# Patient Record
Sex: Male | Born: 1960 | Race: Black or African American | Hispanic: No | Marital: Single | Smoking: Current every day smoker
Health system: Southern US, Community
[De-identification: ages and names within clinical notes are randomized; demographics above are authoritative.]

## PROBLEM LIST (undated history)

## (undated) DIAGNOSIS — M549 Dorsalgia, unspecified: Secondary | ICD-10-CM

## (undated) DIAGNOSIS — G8929 Other chronic pain: Secondary | ICD-10-CM

## (undated) DIAGNOSIS — M5136 Other intervertebral disc degeneration, lumbar region: Secondary | ICD-10-CM

## (undated) DIAGNOSIS — W3400XA Accidental discharge from unspecified firearms or gun, initial encounter: Secondary | ICD-10-CM

## (undated) DIAGNOSIS — M51369 Other intervertebral disc degeneration, lumbar region without mention of lumbar back pain or lower extremity pain: Secondary | ICD-10-CM

## (undated) DIAGNOSIS — M542 Cervicalgia: Secondary | ICD-10-CM

## (undated) DIAGNOSIS — M5030 Other cervical disc degeneration, unspecified cervical region: Secondary | ICD-10-CM

## (undated) HISTORY — PX: ARTHROSCOPY KNEE W/ DRILLING: SUR92

## (undated) HISTORY — PX: HERNIA REPAIR: SHX51

## (undated) HISTORY — PX: KNEE SURGERY: SHX244

---

## 2001-02-24 ENCOUNTER — Encounter: Payer: Self-pay | Admitting: Emergency Medicine

## 2001-02-24 ENCOUNTER — Emergency Department (HOSPITAL_COMMUNITY): Admission: EM | Admit: 2001-02-24 | Discharge: 2001-02-24 | Payer: Self-pay | Admitting: Emergency Medicine

## 2001-04-26 ENCOUNTER — Emergency Department (HOSPITAL_COMMUNITY): Admission: EM | Admit: 2001-04-26 | Discharge: 2001-04-26 | Payer: Self-pay | Admitting: Emergency Medicine

## 2001-05-03 ENCOUNTER — Emergency Department (HOSPITAL_COMMUNITY): Admission: EM | Admit: 2001-05-03 | Discharge: 2001-05-03 | Payer: Self-pay

## 2001-09-28 ENCOUNTER — Emergency Department (HOSPITAL_COMMUNITY): Admission: EM | Admit: 2001-09-28 | Discharge: 2001-09-28 | Payer: Self-pay | Admitting: Emergency Medicine

## 2001-10-15 ENCOUNTER — Emergency Department (HOSPITAL_COMMUNITY): Admission: EM | Admit: 2001-10-15 | Discharge: 2001-10-15 | Payer: Self-pay | Admitting: Emergency Medicine

## 2002-09-16 ENCOUNTER — Emergency Department (HOSPITAL_COMMUNITY): Admission: AD | Admit: 2002-09-16 | Discharge: 2002-09-17 | Payer: Self-pay | Admitting: Emergency Medicine

## 2007-02-20 ENCOUNTER — Emergency Department (HOSPITAL_COMMUNITY): Admission: EM | Admit: 2007-02-20 | Discharge: 2007-02-20 | Payer: Self-pay | Admitting: Emergency Medicine

## 2007-11-18 ENCOUNTER — Emergency Department (HOSPITAL_COMMUNITY): Admission: EM | Admit: 2007-11-18 | Discharge: 2007-11-18 | Payer: Self-pay | Admitting: Emergency Medicine

## 2008-01-05 ENCOUNTER — Emergency Department (HOSPITAL_COMMUNITY): Admission: EM | Admit: 2008-01-05 | Discharge: 2008-01-05 | Payer: Self-pay | Admitting: Emergency Medicine

## 2010-11-15 ENCOUNTER — Emergency Department (HOSPITAL_COMMUNITY): Payer: Self-pay

## 2010-11-15 ENCOUNTER — Encounter (HOSPITAL_COMMUNITY): Payer: Self-pay

## 2010-11-15 ENCOUNTER — Emergency Department (HOSPITAL_COMMUNITY)
Admission: EM | Admit: 2010-11-15 | Discharge: 2010-11-15 | Disposition: A | Discharge status: Home / Self Care | Payer: Self-pay | Attending: Emergency Medicine | Admitting: Emergency Medicine

## 2010-11-15 DIAGNOSIS — S139XXA Sprain of joints and ligaments of unspecified parts of neck, initial encounter: Secondary | ICD-10-CM | POA: Insufficient documentation

## 2010-11-15 DIAGNOSIS — M5030 Other cervical disc degeneration, unspecified cervical region: Secondary | ICD-10-CM | POA: Insufficient documentation

## 2010-11-15 DIAGNOSIS — Y92410 Unspecified street and highway as the place of occurrence of the external cause: Secondary | ICD-10-CM | POA: Insufficient documentation

## 2010-11-15 DIAGNOSIS — IMO0002 Reserved for concepts with insufficient information to code with codable children: Secondary | ICD-10-CM | POA: Insufficient documentation

## 2010-11-15 DIAGNOSIS — M542 Cervicalgia: Secondary | ICD-10-CM | POA: Insufficient documentation

## 2010-11-15 DIAGNOSIS — M25519 Pain in unspecified shoulder: Secondary | ICD-10-CM | POA: Insufficient documentation

## 2010-11-15 DIAGNOSIS — R51 Headache: Secondary | ICD-10-CM | POA: Insufficient documentation

## 2011-09-18 ENCOUNTER — Emergency Department (HOSPITAL_COMMUNITY)
Admission: EM | Admit: 2011-09-18 | Discharge: 2011-09-18 | Disposition: A | Discharge status: Home / Self Care | Payer: Self-pay | Attending: Emergency Medicine | Admitting: Emergency Medicine

## 2011-09-18 ENCOUNTER — Encounter (HOSPITAL_COMMUNITY): Payer: Self-pay | Admitting: Emergency Medicine

## 2011-09-18 DIAGNOSIS — M79609 Pain in unspecified limb: Secondary | ICD-10-CM | POA: Insufficient documentation

## 2011-09-18 DIAGNOSIS — Z1889 Other specified retained foreign body fragments: Secondary | ICD-10-CM | POA: Insufficient documentation

## 2011-09-18 DIAGNOSIS — M79606 Pain in leg, unspecified: Secondary | ICD-10-CM

## 2011-09-18 DIAGNOSIS — F17200 Nicotine dependence, unspecified, uncomplicated: Secondary | ICD-10-CM | POA: Insufficient documentation

## 2011-09-18 MED ORDER — HYDROCODONE-ACETAMINOPHEN 5-325 MG PO TABS: ORAL_TABLET | ORAL | Status: AC

## 2011-09-18 MED ORDER — HYDROCODONE-ACETAMINOPHEN 5-325 MG PO TABS
1.0000 | ORAL_TABLET | Freq: Once | ORAL | Status: AC
  Administered 2011-09-18: 1 via ORAL
  Filled 2011-09-18: qty 1

## 2011-09-18 MED ORDER — NAPROXEN 500 MG PO TABS: 500.0000 mg | ORAL_TABLET | Freq: Two times a day (BID) | ORAL | Status: DC
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
## 2011-09-18 NOTE — ED Provider Notes (Signed)
Medical screening examination/treatment/procedure(s) were performed by non-physician practitioner and as supervising physician I was immediately available for consultation/collaboration.   Lyanne Co, MD 09/18/11 1620
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
## 2011-11-12 ENCOUNTER — Emergency Department (HOSPITAL_COMMUNITY)
Admission: EM | Admit: 2011-11-12 | Discharge: 2011-11-12 | Disposition: A | Discharge status: Home / Self Care | Payer: Self-pay | Attending: Emergency Medicine | Admitting: Emergency Medicine

## 2011-11-12 ENCOUNTER — Encounter (HOSPITAL_COMMUNITY): Payer: Self-pay

## 2011-11-12 ENCOUNTER — Emergency Department (HOSPITAL_COMMUNITY): Payer: Self-pay

## 2011-11-12 DIAGNOSIS — S8990XA Unspecified injury of unspecified lower leg, initial encounter: Secondary | ICD-10-CM | POA: Insufficient documentation

## 2011-11-12 DIAGNOSIS — Z8249 Family history of ischemic heart disease and other diseases of the circulatory system: Secondary | ICD-10-CM | POA: Insufficient documentation

## 2011-11-12 DIAGNOSIS — X58XXXA Exposure to other specified factors, initial encounter: Secondary | ICD-10-CM | POA: Insufficient documentation

## 2011-11-12 DIAGNOSIS — Y990 Civilian activity done for income or pay: Secondary | ICD-10-CM | POA: Insufficient documentation

## 2011-11-12 DIAGNOSIS — Z8489 Family history of other specified conditions: Secondary | ICD-10-CM | POA: Insufficient documentation

## 2011-11-12 DIAGNOSIS — F17200 Nicotine dependence, unspecified, uncomplicated: Secondary | ICD-10-CM | POA: Insufficient documentation

## 2011-11-12 DIAGNOSIS — Z833 Family history of diabetes mellitus: Secondary | ICD-10-CM | POA: Insufficient documentation

## 2011-11-12 MED ORDER — OXYCODONE-ACETAMINOPHEN 5-325 MG PO TABS
1.0000 | ORAL_TABLET | Freq: Once | ORAL | Status: AC
  Administered 2011-11-12: 1 via ORAL
  Filled 2011-11-12: qty 1

## 2011-11-12 MED ORDER — OXYCODONE-ACETAMINOPHEN 5-325 MG PO TABS: 1.0000 | ORAL_TABLET | Freq: Four times a day (QID) | ORAL | Status: AC | PRN

## 2011-11-12 MED ORDER — NAPROXEN 500 MG PO TABS: 500.0000 mg | ORAL_TABLET | Freq: Two times a day (BID) | ORAL | Status: DC

## 2011-11-12 NOTE — ED Notes (Signed)
Discharge instructions reviewed w/ pt., verbalizes understanding. Two prescriptions provided at discharge. 

## 2011-11-12 NOTE — ED Notes (Signed)
Patient reports that he ws climbing out of a hole at work today and heard and felt a pop in the right knee. Patient has swelling of right knee. Patient has increased pain when he walks.

## 2011-11-12 NOTE — ED Provider Notes (Signed)
History     CSN: [id number]  Arrival date & time 11/12/11  4098   First MD Initiated Contact with Patient 11/12/11 1859      Chief Complaint  Patient presents with  . Knee Pain    (Consider location/radiation/quality/duration/timing/severity/associated sxs/prior treatment) HPI Comments: Patient presents with complaint of acute onset of right knee pain that occurred while he was climbing out of a hole while at work. Patient attempted to stand up on his right leg and felt a pop and immediately fell to the ground. Patient complains currently of pain and swelling. Pain does not radiate. He denies weakness, numbness, tingling in lower leg. Walking and palpation makes the pain worse. Rest makes it better. Patient has history of knee surgery, anterior cruciate ligament repair, hardware placed in 1998 after an accident.  Patient is a 51 y.o. male presenting with knee pain. The history is provided by the patient.  Knee Pain This is a new problem. The current episode started today. The problem occurs constantly. The problem has been unchanged. Associated symptoms include arthralgias. Pertinent negatives include no joint swelling, neck pain, numbness or weakness. The symptoms are aggravated by bending and walking. He has tried nothing for the symptoms.    History reviewed. No pertinent past medical history.  Past Surgical History  Procedure Date  . Arthroscopy knee w/ drilling   . Hernia repair     Family History  Problem Relation Age of Onset  . Heart failure Mother   . Diabetes Father   . Aneurysm Sister   . Diabetes Sister     History  Substance Use Topics  . Smoking status: Current Every Day Smoker -- 1.0 packs/day    Types: Cigarettes  . Smokeless tobacco: Never Used  . Alcohol Use: No      Review of Systems  Constitutional: Negative for activity change.  HENT: Negative for neck pain.   Musculoskeletal: Positive for arthralgias. Negative for back pain, joint swelling and  gait problem.  Skin: Negative for wound.  Neurological: Negative for weakness and numbness.    Allergies  Review of patient's allergies indicates no known allergies.  Home Medications   Current Outpatient Rx  Name Route Sig Dispense Refill  . HYDROCODONE-ACETAMINOPHEN 5-325 MG PO TABS Oral Take 1 tablet by mouth every 6 (six) hours as needed. For pain.    Marland Kitchen NAPROXEN 500 MG PO TABS Oral Take 1 tablet (500 mg total) by mouth 2 (two) times daily. 20 tablet 0    BP 131/84  Pulse 62  Temp 99.1 F (37.3 C) (Oral)  Resp 18  SpO2 98%  Physical Exam  Nursing note and vitals reviewed. Constitutional: He appears well-developed and well-nourished.  HENT:  Head: Normocephalic and atraumatic.  Eyes: Conjunctivae normal are normal.  Neck: Normal range of motion. Neck supple.  Cardiovascular: Normal pulses.   Pulses:      Dorsalis pedis pulses are 2+ on the right side, and 2+ on the left side.       Posterior tibial pulses are 2+ on the right side, and 2+ on the left side.  Musculoskeletal: He exhibits edema and tenderness.       Right hip: Normal.       Right knee: He exhibits effusion. He exhibits normal range of motion. tenderness found. Medial joint line and lateral joint line tenderness noted. No patellar tendon tenderness noted.       Right ankle: Normal.  Neurological: He is alert. No sensory deficit.  Motor, sensation, and vascular distal to the injury is fully intact.   Skin: Skin is warm and dry.  Psychiatric: He has a normal mood and affect.    ED Course  Procedures (including critical care time)  Labs Reviewed - No data to display Dg Knee Complete 4 Views Right  11/12/2011  *RADIOLOGY REPORT*  Clinical Data: Right knee pain following a popping sensation. Previous knee surgery.  RIGHT KNEE - COMPLETE 4+ VIEW  Comparison: 11/15/2010.  Findings: Stable changes of previous anterior cruciate ligament repair.  A moderate sized effusion is again demonstrated.  Tricompartmental spur formation is also again demonstrated.  No fracture or dislocation seen.  IMPRESSION:  1.  Moderate sized effusion without significant change. 2.  Stable tricompartmental degenerative changes. 3.  Stable changes of previous ACL repair. 4.  No fracture.   Original Report Authenticated By: Darrol Angel, M.D.      1. Knee injury     7:41 PM Patient seen and examined. X-ray ordered.   Vital signs reviewed and are as follows: Filed Vitals:   11/12/11 1901  BP: 131/84  Pulse: 62  Temp: 99.1 F (37.3 C)  Resp: 18   7:47 PM crutches and knee immobilizer given orthostatic. X-ray reviewed. Patient informed of results. Patient counseled on rice protocol and need for followup with orthopedic doctor if no improvement in one week. He verbalizes understanding and agrees with plan.  Patient counseled on use of narcotic pain medications. Counseled not to combine these medications with others containing tylenol. Urged not to drink alcohol, drive, or perform any other activities that requires focus while taking these medications. The patient verbalizes understanding and agrees with the plan.    MDM  Patient with knee injury, negative x-rays. Will give crutches and knee immobilizer and have patient follow up with orthopedics. No concern for vascular injury in the leg. Compartments of lower leg are soft.       Renne Crigler, [place] 11/12/11 [phone]

## 2011-11-13 NOTE — ED Provider Notes (Signed)
Medical screening examination/treatment/procedure(s) were performed by non-physician practitioner and as supervising physician I was immediately available for consultation/collaboration.  Amiee Wiley R. Giann Obara, MD 11/13/11 0017 

## 2012-07-15 ENCOUNTER — Emergency Department (HOSPITAL_COMMUNITY): Payer: Self-pay

## 2012-07-15 ENCOUNTER — Encounter (HOSPITAL_COMMUNITY): Payer: Self-pay

## 2012-07-15 ENCOUNTER — Emergency Department (HOSPITAL_COMMUNITY)
Admission: EM | Admit: 2012-07-15 | Discharge: 2012-07-15 | Disposition: A | Discharge status: Home / Self Care | Payer: Self-pay | Attending: Emergency Medicine | Admitting: Emergency Medicine

## 2012-07-15 DIAGNOSIS — F17200 Nicotine dependence, unspecified, uncomplicated: Secondary | ICD-10-CM | POA: Insufficient documentation

## 2012-07-15 DIAGNOSIS — Z9889 Other specified postprocedural states: Secondary | ICD-10-CM | POA: Insufficient documentation

## 2012-07-15 DIAGNOSIS — Z87828 Personal history of other (healed) physical injury and trauma: Secondary | ICD-10-CM | POA: Insufficient documentation

## 2012-07-15 DIAGNOSIS — M549 Dorsalgia, unspecified: Secondary | ICD-10-CM | POA: Insufficient documentation

## 2012-07-15 DIAGNOSIS — G8921 Chronic pain due to trauma: Secondary | ICD-10-CM | POA: Insufficient documentation

## 2012-07-15 DIAGNOSIS — M25561 Pain in right knee: Secondary | ICD-10-CM

## 2012-07-15 DIAGNOSIS — M25569 Pain in unspecified knee: Secondary | ICD-10-CM | POA: Insufficient documentation

## 2012-07-15 HISTORY — DX: Accidental discharge from unspecified firearms or gun, initial encounter: W34.00XA

## 2012-07-15 HISTORY — DX: Other chronic pain: G89.29

## 2012-07-15 MED ORDER — OXYCODONE-ACETAMINOPHEN 5-325 MG PO TABS
1.0000 | ORAL_TABLET | Freq: Once | ORAL | Status: AC
  Administered 2012-07-15: 1 via ORAL
  Filled 2012-07-15: qty 1

## 2012-07-15 MED ORDER — HYDROCODONE-ACETAMINOPHEN 5-325 MG PO TABS: 1.0000 | ORAL_TABLET | Freq: Four times a day (QID) | ORAL | Status: DC | PRN

## 2012-07-15 MED ORDER — NAPROXEN 375 MG PO TABS: 375.0000 mg | ORAL_TABLET | Freq: Two times a day (BID) | ORAL | Status: DC
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
## 2012-07-15 NOTE — ED Notes (Signed)
Pt present swith no acute dsitress.  Denies new injury- reports rt leg pain- x 2 months  Ran out of meds- oxycodone and neoproxine. Last taken two days ago.

## 2012-07-15 NOTE — Progress Notes (Signed)
[facility] has seen patient. Patient stated that he is waiting on insurance through his job. I gave patient a list of Primary Care Resources. In case he needed to follow-up with a doctor before his insurance is in effect.

## 2012-07-15 NOTE — ED Notes (Signed)
Ortho tech called 

## 2012-07-26 NOTE — ED Provider Notes (Signed)
Medical screening examination/treatment/procedure(s) were performed by non-physician practitioner and as supervising physician I was immediately available for consultation/collaboration.   Hurman Horn, MD 07/26/12 [phone]

## 2012-08-04 ENCOUNTER — Emergency Department (HOSPITAL_COMMUNITY): Payer: Self-pay

## 2012-08-04 ENCOUNTER — Encounter (HOSPITAL_COMMUNITY): Payer: Self-pay | Admitting: *Deleted

## 2012-08-04 ENCOUNTER — Emergency Department (HOSPITAL_COMMUNITY)
Admission: EM | Admit: 2012-08-04 | Discharge: 2012-08-05 | Disposition: A | Discharge status: Home / Self Care | Payer: Self-pay | Attending: Emergency Medicine | Admitting: Emergency Medicine

## 2012-08-04 DIAGNOSIS — G8929 Other chronic pain: Secondary | ICD-10-CM | POA: Insufficient documentation

## 2012-08-04 DIAGNOSIS — R42 Dizziness and giddiness: Secondary | ICD-10-CM | POA: Insufficient documentation

## 2012-08-04 DIAGNOSIS — M549 Dorsalgia, unspecified: Secondary | ICD-10-CM | POA: Insufficient documentation

## 2012-08-04 DIAGNOSIS — F17200 Nicotine dependence, unspecified, uncomplicated: Secondary | ICD-10-CM | POA: Insufficient documentation

## 2012-08-04 DIAGNOSIS — Z87828 Personal history of other (healed) physical injury and trauma: Secondary | ICD-10-CM | POA: Insufficient documentation

## 2012-08-04 LAB — CBC WITH DIFFERENTIAL/PLATELET
Basophils Absolute: 0 10*3/uL (ref 0.0–0.1)
Basophils Relative: 1 % (ref 0–1)
Eosinophils Absolute: 0.3 10*3/uL (ref 0.0–0.7)
Eosinophils Relative: 4 % (ref 0–5)
HCT: 39.3 % (ref 39.0–52.0)
Hemoglobin: 12.8 g/dL — ABNORMAL LOW (ref 13.0–17.0)
Lymphocytes Relative: 56 % — ABNORMAL HIGH (ref 12–46)
Lymphs Abs: 3.5 10*3/uL (ref 0.7–4.0)
MCH: 27.2 pg (ref 26.0–34.0)
MCHC: 32.6 g/dL (ref 30.0–36.0)
MCV: 83.6 fL (ref 78.0–100.0)
Monocytes Absolute: 0.4 10*3/uL (ref 0.1–1.0)
Monocytes Relative: 6 % (ref 3–12)
Neutro Abs: 2.1 10*3/uL (ref 1.7–7.7)
Neutrophils Relative %: 33 % — ABNORMAL LOW (ref 43–77)
Platelets: 243 10*3/uL (ref 150–400)
RBC: 4.7 MIL/uL (ref 4.22–5.81)
RDW: 14.5 % (ref 11.5–15.5)
WBC: 6.3 10*3/uL (ref 4.0–10.5)

## 2012-08-04 LAB — COMPREHENSIVE METABOLIC PANEL
ALT: 11 U/L (ref 0–53)
AST: 19 U/L (ref 0–37)
Albumin: 3.9 g/dL (ref 3.5–5.2)
Alkaline Phosphatase: 66 U/L (ref 39–117)
BUN: 13 mg/dL (ref 6–23)
CO2: 29 mEq/L (ref 19–32)
Calcium: 9.3 mg/dL (ref 8.4–10.5)
Chloride: 103 mEq/L (ref 96–112)
Creatinine, Ser: 0.92 mg/dL (ref 0.50–1.35)
GFR calc Af Amer: >90 mL/min (ref >90)
GFR calc non Af Amer: >90 mL/min (ref >90)
Glucose, Bld: 102 mg/dL — ABNORMAL HIGH (ref 70–99)
Potassium: 3.9 mEq/L (ref 3.5–5.1)
Sodium: 141 mEq/L (ref 135–145)
Total Bilirubin: 0.3 mg/dL (ref 0.3–1.2)
Total Protein: 7.2 g/dL (ref 6.0–8.3)

## 2012-08-04 LAB — GLUCOSE, CAPILLARY: Glucose-Capillary: 104 mg/dL — ABNORMAL HIGH (ref 70–99)
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
## 2012-08-04 NOTE — ED Notes (Signed)
C/o dizziness x 2 months
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
## 2012-09-06 ENCOUNTER — Encounter (HOSPITAL_COMMUNITY): Payer: Self-pay | Admitting: Emergency Medicine

## 2012-09-06 ENCOUNTER — Emergency Department (HOSPITAL_COMMUNITY)
Admission: EM | Admit: 2012-09-06 | Discharge: 2012-09-06 | Disposition: A | Discharge status: Home / Self Care | Payer: Self-pay | Attending: Emergency Medicine | Admitting: Emergency Medicine

## 2012-09-06 DIAGNOSIS — M51379 Other intervertebral disc degeneration, lumbosacral region without mention of lumbar back pain or lower extremity pain: Secondary | ICD-10-CM | POA: Insufficient documentation

## 2012-09-06 DIAGNOSIS — M542 Cervicalgia: Secondary | ICD-10-CM | POA: Insufficient documentation

## 2012-09-06 DIAGNOSIS — G8929 Other chronic pain: Secondary | ICD-10-CM | POA: Insufficient documentation

## 2012-09-06 DIAGNOSIS — Z87828 Personal history of other (healed) physical injury and trauma: Secondary | ICD-10-CM | POA: Insufficient documentation

## 2012-09-06 DIAGNOSIS — M5030 Other cervical disc degeneration, unspecified cervical region: Secondary | ICD-10-CM | POA: Insufficient documentation

## 2012-09-06 DIAGNOSIS — M5137 Other intervertebral disc degeneration, lumbosacral region: Secondary | ICD-10-CM | POA: Insufficient documentation

## 2012-09-06 DIAGNOSIS — F17200 Nicotine dependence, unspecified, uncomplicated: Secondary | ICD-10-CM | POA: Insufficient documentation

## 2012-09-06 HISTORY — DX: Other cervical disc degeneration, unspecified cervical region: M50.30

## 2012-09-06 HISTORY — DX: Cervicalgia: M54.2

## 2012-09-06 HISTORY — DX: Other intervertebral disc degeneration, lumbar region without mention of lumbar back pain or lower extremity pain: M51.369

## 2012-09-06 HISTORY — DX: Other intervertebral disc degeneration, lumbar region: M51.36

## 2012-09-06 HISTORY — DX: Other chronic pain: G89.29

## 2012-09-06 HISTORY — DX: Dorsalgia, unspecified: M54.9

## 2012-09-06 MED ORDER — NAPROXEN 250 MG PO TABS: 250.0000 mg | ORAL_TABLET | Freq: Two times a day (BID) | ORAL | Status: DC

## 2012-09-06 MED ORDER — HYDROCODONE-ACETAMINOPHEN 5-325 MG PO TABS: ORAL_TABLET | ORAL | Status: DC

## 2012-09-06 MED ORDER — METHOCARBAMOL 500 MG PO TABS: 1000.0000 mg | ORAL_TABLET | Freq: Four times a day (QID) | ORAL | Status: DC | PRN

## 2012-09-06 NOTE — Progress Notes (Signed)
[facility] did not get to see pt but will be sending him information about the [facility], using the address provided.

## 2012-09-06 NOTE — ED Notes (Signed)
Pt reports lower back pain that began last night.  Denies any recent injury/bending/straining.  States pain is midline and it hurts to move.  Denies numbness/tingling down legs.  Pt able to ambulate
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
## 2013-03-24 ENCOUNTER — Emergency Department (HOSPITAL_COMMUNITY)
Admission: EM | Admit: 2013-03-24 | Discharge: 2013-03-24 | Disposition: A | Discharge status: Home / Self Care | Payer: Self-pay | Attending: Emergency Medicine | Admitting: Emergency Medicine

## 2013-03-24 ENCOUNTER — Emergency Department (HOSPITAL_COMMUNITY): Payer: Self-pay

## 2013-03-24 ENCOUNTER — Encounter (HOSPITAL_COMMUNITY): Payer: Self-pay | Admitting: Emergency Medicine

## 2013-03-24 DIAGNOSIS — Z87828 Personal history of other (healed) physical injury and trauma: Secondary | ICD-10-CM | POA: Insufficient documentation

## 2013-03-24 DIAGNOSIS — F17200 Nicotine dependence, unspecified, uncomplicated: Secondary | ICD-10-CM | POA: Insufficient documentation

## 2013-03-24 DIAGNOSIS — Z9889 Other specified postprocedural states: Secondary | ICD-10-CM | POA: Insufficient documentation

## 2013-03-24 DIAGNOSIS — G8929 Other chronic pain: Secondary | ICD-10-CM | POA: Insufficient documentation

## 2013-03-24 DIAGNOSIS — M76899 Other specified enthesopathies of unspecified lower limb, excluding foot: Secondary | ICD-10-CM | POA: Insufficient documentation

## 2013-03-24 DIAGNOSIS — M7060 Trochanteric bursitis, unspecified hip: Secondary | ICD-10-CM

## 2013-03-24 MED ORDER — HYDROCODONE-ACETAMINOPHEN 5-325 MG PO TABS: ORAL_TABLET | ORAL | Status: DC

## 2013-03-24 MED ORDER — NAPROXEN 500 MG PO TABS: 500.0000 mg | ORAL_TABLET | Freq: Two times a day (BID) | ORAL | Status: DC

## 2013-03-24 NOTE — Discharge Instructions (Signed)
Please read and follow all provided instructions.  Your diagnoses today include:  1. Greater trochanteric bursitis     Tests performed today include:  An x-ray of the affected area - does NOT show any broken bones, shows arthritis  Vital signs. See below for your results today.   Medications prescribed:   Vicodin (hydrocodone/acetaminophen) - narcotic pain medication  DO NOT drive or perform any activities that require you to be awake and alert because this medicine can make you drowsy. BE VERY CAREFUL not to take multiple medicines containing Tylenol (also called acetaminophen). Doing so can lead to an overdose which can damage your liver and cause liver failure and possibly death.   Naproxen - anti-inflammatory pain medication  Do not exceed 500mg  naproxen every 12 hours, take with food  You have been prescribed an anti-inflammatory medication or NSAID. Take with food. Take smallest effective dose for the shortest duration needed for your pain. Stop taking if you experience stomach pain or vomiting.   Take any prescribed medications only as directed.  Home care instructions:   Follow any educational materials contained in this packet  Follow R.I.C.E. Protocol:  R - rest your injury   I  - use ice on injury without applying directly to skin  C - compress injury with bandage or splint  E - elevate the injury as much as possible  Follow-up instructions: Please follow-up with your primary care provider or the provided orthopedic physician (bone specialist) if you continue to have significant pain or trouble walking in 1 week. In this case you may have a severe injury that requires further care.   If you do not have a primary care doctor -- see below for referral information.   Return instructions:   Please return if your toes are numb or tingling, appear gray or blue, or you have severe pain (also elevate leg and loosen splint or wrap if you were given one)  Please  return to the Emergency Department if you experience worsening symptoms.   Please return if you have any other emergent concerns.  Additional Information:  Your vital signs today were: BP 124/70   Pulse 83   Temp(Src) 98.5 F (36.9 C) (Oral)   Resp 20   SpO2 97% If your blood pressure (BP) was elevated above 135/85 this visit, please have this repeated by your doctor within one month. -------------- If prescribed crutches for your injury: use crutches with non-weight bearing for the first few days. Then, you may walk as the pain allows, or as instructed. Start gradually with weight bearing on the affected side. Once you can walk pain free, then try jogging. When you can run forwards, then you can try moving side-to-side. If you cannot walk without crutches in one week, you need a re-check. --------------  Emergency Department Resource Guide 1) Find a Doctor and Pay Out of Pocket Although you won't have to find out who is covered by your insurance plan, it is a good idea to ask around and get recommendations. You will then need to call the office and see if the doctor you have chosen will accept you as a new patient and what types of options they offer for patients who are self-pay. Some doctors offer discounts or will set up payment plans for their patients who do not have insurance, but you will need to ask so you aren't surprised when you get to your appointment.  2) Contact Your Local Health Department Not all health departments have  doctors that can see patients for sick visits, but many do, so it is worth a call to see if yours does. If you don't know where your local health department is, you can check in your phone book. The CDC also has a tool to help you locate your state's health department, and many state websites also have listings of all of their local health departments.  3) Find a Walk-in Clinic If your illness is not likely to be very severe or complicated, you may want to try a  walk in clinic. These are popping up all over the country in pharmacies, drugstores, and shopping centers. They're usually staffed by nurse practitioners or physician assistants that have been trained to treat common illnesses and complaints. They're usually fairly quick and inexpensive. However, if you have serious medical issues or chronic medical problems, these are probably not your best option.  No Primary Care Doctor: - Call Health Connect at  [phone] - they can help you locate a primary care doctor that  accepts your insurance, provides certain services, etc. - Physician Referral Service- [phone]  Chronic Pain Problems: Organization         Address  Phone   Notes  [facility]  [phone] Patients need to be referred by their primary care doctor.   Medication Assistance: Organization         Address  Phone   Notes  [facility] Medication [facility] [address] [place] [place], [place] [address] [phone] --Must be a resident of [facility] -- Must have NO insurance coverage whatsoever (no Medicaid/ Medicare, etc.) -- The pt. MUST have a primary care doctor that directs their care regularly and follows them in the community   Med[facility]ist  [phone]   [employer]  [phone]    Agencies that provide inexpensive medical care: Organization         Address  Phone   Notes  [facility]  [phone]   [facility]    [phone]   [facility] [address] [place], [place] [address] [phone]   [facility] [address] [place], [place] [phone]   Planned Parenthood    [phone]   [facility]    [phone]   [facility]  [address], [place] Phone:  [phone], Fax:  [phone] Hours of Operation:  9 am - 6 pm, M-F.  Also accepts Medicaid/Medicare and self-pay.  [facility]  [address], [place] Phone: [phone], Fax: [phone]. Hours of Operation:  8:30 am - 5:30 pm, M-F.  Also accepts Medicaid and self-pay.  [facility] [place] [address] [place] Phone: [phone]   [facility] [address] Natasha Bence [place], [place] [phone], Ext. [phone] Mondays & Thursdays: 7-9 AM.  First 15 patients are seen on a first come, first serve basis.    Medicaid-accepting [facility] Providers:  Organization         Address  Phone   Notes  [facility] [address], [place] [phone] Also accepts self-pay patients.  [facility] [address] Laurell Josephs [place], [place]  [phone]   [facility] [address], [place] [phone]   [facility] [address], [place] [phone]) [phone]   Renaye RakersVeita Bland [address], [place]   [phone] Only accepts [place] Access [place] patients after they have their name applied to their card.   Self-Pay (no insurance) in [facility]:  Organization         Address  Phone   Notes  Sickle Cell Patients, [facility] [address], [place] [phone]) [phone]   [facility] [address], [place] [phone]) [phone]   [facility] [place]  [address], [place] [phone]   [facility]/Dr. Osei-Bonsu  [address] [place] [address], [place] or [phone], [place] [phone]) [phone] Phone number for both [place] and [place] locations is the same.  [facility] [address], [place] [phone]) [phone]   [facility] [place] [facility] [address] [place] [address], [place] or [address] [phone](336) [phone]) [phone]   [facility] [address], [place] [phone]) [phone], phone; [phone](336) [phone], fax Sees  patients 1st and 3rd Saturday of every month.  Must not qualify for public or private insurance (i.e. Medicaid, Medicare, [place] Health Choice, Veterans' Benefits)  Household income should be no more than 200% of the poverty level The clinic cannot treat you if you are pregnant or think you are pregnant  Sexually transmitted diseases are not treated at the clinic.    Dental Care: Organization         Address  Phone  Notes  [facility] [address], [place] [phone]) [phone] Accepts children up to age 53 who are enrolled in [place] or [place] Health Choice; pregnant women with a Medicaid card; and children who have applied for Medicaid or [place] Health Choice, but were declined, whose parents can pay a reduced fee at time of service.  [facility] [place]  [address], [place] [phone]) [phone] Accepts children up to age 53 who are enrolled in [place] or [place] Health Choice; pregnant women with a Medicaid card; and children who have applied for Medicaid or [place] Health Choice, but were declined, whose parents can pay a reduced fee at time of service.  [facility]  [address], [place] [phone]) [phone] Patients are seen by appointment only. Walk-ins are not accepted. [facility] will see patients 53 years of age and older. Monday - Tuesday (8am-5pm) Most Wednesdays (8:30-5pm) $30 per visit, cash only  [facility]  [address], [place] [facility] [place] [phone] Patients are seen by appointment only. Walk-ins are not accepted. [facility] will see patients 53 years of age and older. One Wednesday Evening (Monthly: Volunteer Based).  $30 per visit, cash only  [employer] [facility]  [phone]) [phone] for adults; Children under age 704, call Graduate Pediatric Dentistry at [phone]) [phone]. Children aged 884-14, please call [phone] to request a  pediatric application.  Dental services are provided in all areas of dental care including fillings, crowns and bridges, complete and partial dentures, implants, gum treatment, root canals, and extractions. Preventive care is also provided. Treatment is provided to both adults and children. Patients are selected via a lottery and there is often a waiting list.   [facility] [address], [place]  [phone] [email or web address]   [facility] [phone]  [address], [place], [place] [phone], Ext. [phone] Second and Fourth Thursday of each month, opens at 6:30 AM; Clinic ends at 9 AM.  Patients are seen on a first-come first-served basis, and a limited number are seen during each clinic.   [facility]  [address] Ether Griffins [place], [place] [phone]   Eligibility Requirements You must have lived in [place], [place], or [place] counties for at least the last three months.   You cannot be eligible for state or federal sponsored [place], including [employer], [place], or [employer].   You generally cannot be eligible for healthcare insurance through your employer.    How to apply: Eligibility screenings are held every Tuesday and Wednesday afternoon from 1:00 pm until 4:00 pm. You do not need an appointment for the interview!  [facility] [address], [place], [place] [phone]   [facility] Health Department  [phone]   [facility] Health Department  [phone]   [facility]  [phone]    Behavioral Health Resources in the Community: Intensive Outpatient Programs Organization         Address  Phone  Notes  [facility] Services [address], [place], [place] [phone]   [facility] Outpatient [address] [place], [place] [phone]   [facility] [address] [place], [place]  [phone]   [facility]  Mental Health [address],  [place], [place] [phone] or [phone]   Substance Abuse Resources Organization         Address  Phone  Notes  [facility] Drug Services  [phone]   [facility]  [phone]   [facility]  [phone]   Floydene Flock  [phone]   Residential & Outpatient Substance Abuse Program  [phone]   Psychological Services Organization         Address  Phone  Notes  [facility] Behavioral Health  [phone]   [facility]  [phone]   [facility] Mental Health [address] [place] [phone] or [phone]    Mobile Crisis Teams Organization         Address  Phone  Notes  [facility], Mobile Crisis Care Unit  [phone]   [facility]  [address] [place], [place] [phone]   Doristine Locks [address] [place] [phone]    Self-Help/Support Groups Organization         Address  Phone             Notes  [facility] [place] - variety of support groups  [phone]- I7437963 Call for more information  Narcotics Anonymous (NA), [facility] [address], [employer] [place]  2 meetings at this location   Statistician         Address  Phone  Notes  [facility] [address],    [place]  [phone]   [facility]  [address], [place] [address]24097, [place], [place] [phone]   [facility] [address] [place], [place] [phone] Admissions: 8am-3pm M-F  [facility] [address]    [place], [place] [phone]   [facility] [address] Starling Manns [place], [place] [phone]   [facility] [address],  [place], [place] [phone]   [facility] - Intensive Outpatient [phone] Laurell Josephs [address], [place], [place]  [phone]   [facility] [address]    [place], [place] [phone] or [phone]   [facility] ([facility]) [address] [place], [place] [phone] Accepts Medicaid  [facility] [place] [address],  [place] [phone] Substance Abuse/Addiction Treatment   [facility] Organization         Address  Phone  Notes  [facility]  [phone]   Angie Fava, PhD [address] Ervin Knack [place], [place]   8015893076 or [phone]   [facility] Behavioral   [address] [place], [place] [phone]   [facility] [address] [place], [place] [phone] Insurance/Medicaid/sponsorship through [facility] and Families [address]                                    [place], [place] [phone] Therapy/tele-psych/case  [facility] [address], [place] [phone]    Dr. Lolly Mustache  [phone]   [facility] [place]  [employer] [facility] Dept. 1) [address] [place] 2) [address] [place] 3)  [address], [place] [phone]  [phone]   [facility] Child Abuse Hotline 706-534-5885 or [phone] (After Hours)

## 2013-03-24 NOTE — ED Notes (Signed)
Pt reports 10/10 right sided hip pain that started yesterday, which has worsened today. Pt reports using crutches to ambulate from prior knee injury. Pt denies any trauma or injury. Pt is in NAD, A/O x4, and vital signs are WDL.
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
## 2013-03-24 NOTE — ED Provider Notes (Signed)
Medical screening examination/treatment/procedure(s) were performed by non-physician practitioner and as supervising physician I was immediately available for consultation/collaboration.  EKG Interpretation   None         Richardean Canalavid H Yao, MD 03/24/13 [id number]

## 2013-10-20 ENCOUNTER — Emergency Department (HOSPITAL_COMMUNITY)
Admission: EM | Admit: 2013-10-20 | Discharge: 2013-10-20 | Disposition: A | Discharge status: Home / Self Care | Payer: Self-pay | Attending: Emergency Medicine | Admitting: Emergency Medicine

## 2013-10-20 ENCOUNTER — Emergency Department (HOSPITAL_COMMUNITY): Payer: Self-pay

## 2013-10-20 ENCOUNTER — Encounter (HOSPITAL_COMMUNITY): Payer: Self-pay | Admitting: Emergency Medicine

## 2013-10-20 DIAGNOSIS — M549 Dorsalgia, unspecified: Secondary | ICD-10-CM | POA: Insufficient documentation

## 2013-10-20 DIAGNOSIS — M545 Low back pain, unspecified: Secondary | ICD-10-CM | POA: Insufficient documentation

## 2013-10-20 DIAGNOSIS — Z87828 Personal history of other (healed) physical injury and trauma: Secondary | ICD-10-CM | POA: Insufficient documentation

## 2013-10-20 DIAGNOSIS — Z791 Long term (current) use of non-steroidal anti-inflammatories (NSAID): Secondary | ICD-10-CM | POA: Insufficient documentation

## 2013-10-20 DIAGNOSIS — G8929 Other chronic pain: Secondary | ICD-10-CM | POA: Insufficient documentation

## 2013-10-20 DIAGNOSIS — F17200 Nicotine dependence, unspecified, uncomplicated: Secondary | ICD-10-CM | POA: Insufficient documentation

## 2013-10-20 DIAGNOSIS — R52 Pain, unspecified: Secondary | ICD-10-CM | POA: Insufficient documentation

## 2013-10-20 MED ORDER — OXYCODONE-ACETAMINOPHEN 5-325 MG PO TABS
2.0000 | ORAL_TABLET | Freq: Once | ORAL | Status: AC
  Administered 2013-10-20: 2 via ORAL
  Filled 2013-10-20: qty 2

## 2013-10-20 MED ORDER — OXYCODONE-ACETAMINOPHEN 5-325 MG PO TABS
2.0000 | ORAL_TABLET | Freq: Once | ORAL | Status: DC
  Filled 2013-10-20: qty 2

## 2013-10-20 MED ORDER — TRAMADOL-ACETAMINOPHEN 37.5-325 MG PO TABS: 1.0000 | ORAL_TABLET | Freq: Four times a day (QID) | ORAL | Status: DC | PRN
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
## 2013-10-20 NOTE — ED Notes (Signed)
Pt stated that he lifted a heavy a heavy trailer axle. Increased pain in middle and lower back noted last night.Hx back pain

## 2013-10-20 NOTE — ED Provider Notes (Signed)
Medical screening examination/treatment/procedure(s) were performed by non-physician practitioner and as supervising physician I was immediately available for consultation/collaboration.   Stuart Guillen T Moosa Bueche, MD 10/20/13 2300 

## 2013-10-20 NOTE — Discharge Instructions (Signed)
Take the prescribed medication as directed. Follow-up with a primary care physician in the area.  Use the resource guide you were given to help with this. Return to the ED for new or worsening symptoms.
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
## 2014-08-25 ENCOUNTER — Emergency Department (HOSPITAL_COMMUNITY): Payer: Self-pay

## 2014-08-25 ENCOUNTER — Emergency Department (HOSPITAL_COMMUNITY)
Admission: EM | Admit: 2014-08-25 | Discharge: 2014-08-25 | Disposition: A | Discharge status: Home / Self Care | Payer: Self-pay | Attending: Emergency Medicine | Admitting: Emergency Medicine

## 2014-08-25 ENCOUNTER — Encounter (HOSPITAL_COMMUNITY): Payer: Self-pay | Admitting: Emergency Medicine

## 2014-08-25 DIAGNOSIS — S299XXA Unspecified injury of thorax, initial encounter: Secondary | ICD-10-CM | POA: Insufficient documentation

## 2014-08-25 DIAGNOSIS — S4992XA Unspecified injury of left shoulder and upper arm, initial encounter: Secondary | ICD-10-CM | POA: Insufficient documentation

## 2014-08-25 DIAGNOSIS — Y92410 Unspecified street and highway as the place of occurrence of the external cause: Secondary | ICD-10-CM | POA: Insufficient documentation

## 2014-08-25 DIAGNOSIS — T148XXA Other injury of unspecified body region, initial encounter: Secondary | ICD-10-CM

## 2014-08-25 DIAGNOSIS — Y9389 Activity, other specified: Secondary | ICD-10-CM | POA: Insufficient documentation

## 2014-08-25 DIAGNOSIS — R05 Cough: Secondary | ICD-10-CM | POA: Insufficient documentation

## 2014-08-25 DIAGNOSIS — Z8739 Personal history of other diseases of the musculoskeletal system and connective tissue: Secondary | ICD-10-CM | POA: Insufficient documentation

## 2014-08-25 DIAGNOSIS — G8929 Other chronic pain: Secondary | ICD-10-CM | POA: Insufficient documentation

## 2014-08-25 DIAGNOSIS — T148 Other injury of unspecified body region: Secondary | ICD-10-CM | POA: Insufficient documentation

## 2014-08-25 DIAGNOSIS — S3992XA Unspecified injury of lower back, initial encounter: Secondary | ICD-10-CM | POA: Insufficient documentation

## 2014-08-25 DIAGNOSIS — Z720 Tobacco use: Secondary | ICD-10-CM | POA: Insufficient documentation

## 2014-08-25 DIAGNOSIS — Z791 Long term (current) use of non-steroidal anti-inflammatories (NSAID): Secondary | ICD-10-CM | POA: Insufficient documentation

## 2014-08-25 DIAGNOSIS — Y998 Other external cause status: Secondary | ICD-10-CM | POA: Insufficient documentation

## 2014-08-25 MED ORDER — IBUPROFEN 800 MG PO TABS: 800.0000 mg | ORAL_TABLET | Freq: Three times a day (TID) | ORAL | Status: DC

## 2014-08-25 MED ORDER — IBUPROFEN 800 MG PO TABS
800.0000 mg | ORAL_TABLET | Freq: Once | ORAL | Status: AC
  Administered 2014-08-25: 800 mg via ORAL
  Filled 2014-08-25: qty 1

## 2014-08-25 MED ORDER — CYCLOBENZAPRINE HCL 10 MG PO TABS
10.0000 mg | ORAL_TABLET | Freq: Once | ORAL | Status: AC
  Administered 2014-08-25: 10 mg via ORAL
  Filled 2014-08-25: qty 1

## 2014-08-25 MED ORDER — CYCLOBENZAPRINE HCL 10 MG PO TABS: 10.0000 mg | ORAL_TABLET | Freq: Two times a day (BID) | ORAL | Status: DC | PRN

## 2014-08-25 NOTE — ED Notes (Signed)
Pulse apical 60, pt a&ox4, ambulatory. Questions, concerns r/t dc were denied
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
## 2014-08-25 NOTE — ED Notes (Signed)
Patient transported to X-ray 
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
## 2016-04-05 ENCOUNTER — Emergency Department (HOSPITAL_COMMUNITY)
Admission: EM | Admit: 2016-04-05 | Discharge: 2016-04-05 | Disposition: A | Discharge status: Home / Self Care | Attending: Emergency Medicine | Admitting: Emergency Medicine

## 2016-04-05 ENCOUNTER — Encounter (HOSPITAL_COMMUNITY): Payer: Self-pay | Admitting: Emergency Medicine

## 2016-04-05 DIAGNOSIS — R509 Fever, unspecified: Secondary | ICD-10-CM | POA: Diagnosis present

## 2016-04-05 DIAGNOSIS — F17210 Nicotine dependence, cigarettes, uncomplicated: Secondary | ICD-10-CM | POA: Diagnosis not present

## 2016-04-05 DIAGNOSIS — R55 Syncope and collapse: Secondary | ICD-10-CM | POA: Insufficient documentation

## 2016-04-05 DIAGNOSIS — R69 Illness, unspecified: Secondary | ICD-10-CM

## 2016-04-05 DIAGNOSIS — J111 Influenza due to unidentified influenza virus with other respiratory manifestations: Secondary | ICD-10-CM | POA: Diagnosis not present

## 2016-04-05 LAB — COMPREHENSIVE METABOLIC PANEL
ALT: 12 U/L — ABNORMAL LOW (ref 17–63)
AST: 31 U/L (ref 15–41)
Albumin: 4.3 g/dL (ref 3.5–5.0)
Alkaline Phosphatase: 66 U/L (ref 38–126)
Anion gap: 10 (ref 5–15)
BUN: 13 mg/dL (ref 6–20)
CHLORIDE: 98 mmol/L — AB (ref 101–111)
CO2: 23 mmol/L (ref 22–32)
Calcium: 8.6 mg/dL — ABNORMAL LOW (ref 8.9–10.3)
Creatinine, Ser: 1.07 mg/dL (ref 0.61–1.24)
GFR calc Af Amer: >60 mL/min (ref >60)
Glucose, Bld: 166 mg/dL — ABNORMAL HIGH (ref 65–99)
POTASSIUM: 4.3 mmol/L (ref 3.5–5.1)
Sodium: 131 mmol/L — ABNORMAL LOW (ref 135–145)
Total Bilirubin: 0.5 mg/dL (ref 0.3–1.2)
Total Protein: 7.6 g/dL (ref 6.5–8.1)

## 2016-04-05 LAB — CBC WITH DIFFERENTIAL/PLATELET
BASOS ABS: 0 10*3/uL (ref 0.0–0.1)
BASOS PCT: 0 %
EOS PCT: 0 %
Eosinophils Absolute: 0 10*3/uL (ref 0.0–0.7)
HCT: 39.1 % (ref 39.0–52.0)
Hemoglobin: 12.5 g/dL — ABNORMAL LOW (ref 13.0–17.0)
LYMPHS PCT: 12 %
Lymphs Abs: 0.8 10*3/uL (ref 0.7–4.0)
MCH: 26.7 pg (ref 26.0–34.0)
MCHC: 32 g/dL (ref 30.0–36.0)
MCV: 83.4 fL (ref 78.0–100.0)
MONO ABS: 0.7 10*3/uL (ref 0.1–1.0)
Monocytes Relative: 10 %
Neutro Abs: 5.2 10*3/uL (ref 1.7–7.7)
Neutrophils Relative %: 78 %
PLATELETS: 218 10*3/uL (ref 150–400)
RBC: 4.69 MIL/uL (ref 4.22–5.81)
RDW: 14.4 % (ref 11.5–15.5)
WBC: 6.6 10*3/uL (ref 4.0–10.5)

## 2016-04-05 LAB — I-STAT CG4 LACTIC ACID, ED
LACTIC ACID, VENOUS: 2.02 mmol/L — AB (ref 0.5–1.9)
Lactic Acid, Venous: 0.67 mmol/L (ref 0.5–1.9)

## 2016-04-05 LAB — CBG MONITORING, ED: GLUCOSE-CAPILLARY: 173 mg/dL — AB (ref 65–99)

## 2016-04-05 MED ORDER — OSELTAMIVIR PHOSPHATE 75 MG PO CAPS: 75.0000 mg | ORAL_CAPSULE | Freq: Two times a day (BID) | ORAL | 0 refills | Status: DC

## 2016-04-05 MED ORDER — OSELTAMIVIR PHOSPHATE 75 MG PO CAPS
75.0000 mg | ORAL_CAPSULE | Freq: Once | ORAL | Status: AC
  Administered 2016-04-05: 75 mg via ORAL
  Filled 2016-04-05: qty 1

## 2016-04-05 MED ORDER — SODIUM CHLORIDE 0.9 % IV BOLUS (SEPSIS)
2000.0000 mL | INTRAVENOUS | Status: AC
  Administered 2016-04-05: 2000 mL via INTRAVENOUS

## 2016-04-05 NOTE — Discharge Instructions (Signed)
1. Medications: Tamiflu, alternate tylenol and ibuprofen for fever control, usual home medications °2. Treatment: rest, drink plenty of fluids,  °3. Follow Up: Please followup with your primary doctor in 2 days for discussion of your diagnoses and further evaluation after today's visit; if you do not have a primary care doctor use the resource guide provided to find one; Please return to the ER for syncope, difficulty breathing, persistent high fevers, intractable vomiting or other concerns ° °

## 2016-04-05 NOTE — ED Triage Notes (Signed)
Patient complaining of flu like symptoms. Patient states these symptoms two days ago. Patient states he has the chills, body aches, fever, and diarrhea.
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
## 2016-04-05 NOTE — ED Notes (Signed)
Pt ambulated well in the room and in the hall without assistance, gait and balance steady.

## 2017-05-30 ENCOUNTER — Encounter (HOSPITAL_COMMUNITY): Payer: Self-pay

## 2017-05-30 ENCOUNTER — Emergency Department (HOSPITAL_COMMUNITY)

## 2017-05-30 ENCOUNTER — Emergency Department (HOSPITAL_COMMUNITY)
Admission: EM | Admit: 2017-05-30 | Discharge: 2017-05-30 | Disposition: A | Discharge status: Home / Self Care | Attending: Emergency Medicine | Admitting: Emergency Medicine

## 2017-05-30 ENCOUNTER — Other Ambulatory Visit: Payer: Self-pay

## 2017-05-30 DIAGNOSIS — M545 Low back pain, unspecified: Secondary | ICD-10-CM

## 2017-05-30 DIAGNOSIS — W108XXA Fall (on) (from) other stairs and steps, initial encounter: Secondary | ICD-10-CM | POA: Insufficient documentation

## 2017-05-30 DIAGNOSIS — Y999 Unspecified external cause status: Secondary | ICD-10-CM | POA: Insufficient documentation

## 2017-05-30 DIAGNOSIS — Z79899 Other long term (current) drug therapy: Secondary | ICD-10-CM | POA: Insufficient documentation

## 2017-05-30 DIAGNOSIS — G8929 Other chronic pain: Secondary | ICD-10-CM | POA: Insufficient documentation

## 2017-05-30 DIAGNOSIS — Y929 Unspecified place or not applicable: Secondary | ICD-10-CM | POA: Insufficient documentation

## 2017-05-30 DIAGNOSIS — Y939 Activity, unspecified: Secondary | ICD-10-CM | POA: Insufficient documentation

## 2017-05-30 DIAGNOSIS — F17210 Nicotine dependence, cigarettes, uncomplicated: Secondary | ICD-10-CM | POA: Insufficient documentation

## 2017-05-30 MED ORDER — NAPROXEN 500 MG PO TABS: 500.0000 mg | ORAL_TABLET | Freq: Two times a day (BID) | ORAL | 0 refills | Status: DC

## 2017-05-30 MED ORDER — HYDROCODONE-ACETAMINOPHEN 5-325 MG PO TABS: 1.0000 | ORAL_TABLET | Freq: Once | ORAL | Status: DC
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
## 2017-05-30 NOTE — ED Triage Notes (Signed)
Patient complains of left lower back pain after falling from short step stool today, no loc. Patient diaphoretic on arrival and states that he sweats when in pain. Alert and oriented, VSS

## 2017-05-30 NOTE — ED Notes (Signed)
Unable to locate patient in room x3

## 2017-05-30 NOTE — Discharge Instructions (Signed)
Please read instructions below.  Talk with your PCP about any new medications.  You can take naproxen every 12 hours as needed for pain.  Apply ice to your back for 20 minutes at a time. Return to ER if new numbness or tingling in your arms or legs, inability to urinate, inability to hold your bowels, or weakness in your extremities.

## 2018-10-12 ENCOUNTER — Ambulatory Visit: Payer: Medicaid Other | Attending: Nurse Practitioner | Admitting: Nurse Practitioner

## 2018-10-12 ENCOUNTER — Encounter: Payer: Self-pay | Admitting: Nurse Practitioner

## 2018-10-12 ENCOUNTER — Other Ambulatory Visit: Payer: Self-pay

## 2018-10-12 DIAGNOSIS — M545 Low back pain, unspecified: Secondary | ICD-10-CM

## 2018-10-12 DIAGNOSIS — M25561 Pain in right knee: Secondary | ICD-10-CM

## 2018-10-12 DIAGNOSIS — Z13220 Encounter for screening for lipoid disorders: Secondary | ICD-10-CM

## 2018-10-12 DIAGNOSIS — Z125 Encounter for screening for malignant neoplasm of prostate: Secondary | ICD-10-CM | POA: Diagnosis not present

## 2018-10-12 DIAGNOSIS — Z87828 Personal history of other (healed) physical injury and trauma: Secondary | ICD-10-CM | POA: Insufficient documentation

## 2018-10-12 DIAGNOSIS — M5136 Other intervertebral disc degeneration, lumbar region: Secondary | ICD-10-CM | POA: Diagnosis not present

## 2018-10-12 DIAGNOSIS — R7309 Other abnormal glucose: Secondary | ICD-10-CM

## 2018-10-12 DIAGNOSIS — Z1211 Encounter for screening for malignant neoplasm of colon: Secondary | ICD-10-CM

## 2018-10-12 DIAGNOSIS — R739 Hyperglycemia, unspecified: Secondary | ICD-10-CM | POA: Insufficient documentation

## 2018-10-12 DIAGNOSIS — G8929 Other chronic pain: Secondary | ICD-10-CM | POA: Insufficient documentation

## 2018-10-12 DIAGNOSIS — F17210 Nicotine dependence, cigarettes, uncomplicated: Secondary | ICD-10-CM | POA: Diagnosis not present

## 2018-10-12 DIAGNOSIS — M79672 Pain in left foot: Secondary | ICD-10-CM | POA: Diagnosis not present

## 2018-10-12 DIAGNOSIS — M898X7 Other specified disorders of bone, ankle and foot: Secondary | ICD-10-CM

## 2018-10-12 DIAGNOSIS — M542 Cervicalgia: Secondary | ICD-10-CM | POA: Insufficient documentation
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
## 2018-10-13 ENCOUNTER — Other Ambulatory Visit: Payer: Self-pay | Admitting: Nurse Practitioner

## 2018-10-13 ENCOUNTER — Ambulatory Visit: Payer: Medicaid Other

## 2018-10-13 ENCOUNTER — Ambulatory Visit (HOSPITAL_COMMUNITY)
Admission: RE | Admit: 2018-10-13 | Discharge: 2018-10-13 | Disposition: A | Discharge status: Home / Self Care | Payer: Medicaid Other | Source: Ambulatory Visit | Attending: Nurse Practitioner | Admitting: Nurse Practitioner

## 2018-10-13 ENCOUNTER — Encounter: Payer: Self-pay | Admitting: Gastroenterology

## 2018-10-13 ENCOUNTER — Other Ambulatory Visit: Payer: Self-pay

## 2018-10-13 DIAGNOSIS — M898X7 Other specified disorders of bone, ankle and foot: Secondary | ICD-10-CM | POA: Diagnosis present

## 2018-10-13 DIAGNOSIS — M25561 Pain in right knee: Secondary | ICD-10-CM | POA: Insufficient documentation

## 2018-10-13 DIAGNOSIS — M545 Low back pain, unspecified: Secondary | ICD-10-CM

## 2018-10-13 DIAGNOSIS — Z125 Encounter for screening for malignant neoplasm of prostate: Secondary | ICD-10-CM

## 2018-10-13 DIAGNOSIS — M150 Primary generalized (osteo)arthritis: Secondary | ICD-10-CM

## 2018-10-13 DIAGNOSIS — R7309 Other abnormal glucose: Secondary | ICD-10-CM

## 2018-10-13 DIAGNOSIS — G8929 Other chronic pain: Secondary | ICD-10-CM | POA: Insufficient documentation

## 2018-10-13 DIAGNOSIS — M25461 Effusion, right knee: Secondary | ICD-10-CM

## 2018-10-13 DIAGNOSIS — Z13220 Encounter for screening for lipoid disorders: Secondary | ICD-10-CM

## 2018-10-14 LAB — URIC ACID: Uric Acid: 5 mg/dL (ref 3.7–8.6)

## 2018-10-14 LAB — CBC
Hematocrit: 45 % (ref 37.5–51.0)
Hemoglobin: 14 g/dL (ref 13.0–17.7)
MCH: 27.5 pg (ref 26.6–33.0)
MCHC: 31.1 g/dL — ABNORMAL LOW (ref 31.5–35.7)
MCV: 88 fL (ref 79–97)
Platelets: 233 10*3/uL (ref 150–450)
RBC: 5.09 x10E6/uL (ref 4.14–5.80)
RDW: 14 % (ref 11.6–15.4)
WBC: 4.9 10*3/uL (ref 3.4–10.8)

## 2018-10-14 LAB — CMP14+EGFR
ALT: 13 IU/L (ref 0–44)
AST: 15 IU/L (ref 0–40)
Albumin/Globulin Ratio: 1.6 (ref 1.2–2.2)
Albumin: 4.4 g/dL (ref 3.8–4.9)
Alkaline Phosphatase: 77 IU/L (ref 39–117)
BUN/Creatinine Ratio: 11 (ref 9–20)
BUN: 10 mg/dL (ref 6–24)
Bilirubin Total: 0.3 mg/dL (ref 0.0–1.2)
CO2: 26 mmol/L (ref 20–29)
Calcium: 9.3 mg/dL (ref 8.7–10.2)
Chloride: 100 mmol/L (ref 96–106)
Creatinine, Ser: 0.93 mg/dL (ref 0.76–1.27)
GFR calc Af Amer: 104 mL/min/{1.73_m2} (ref >59)
GFR calc non Af Amer: 90 mL/min/{1.73_m2} (ref >59)
Globulin, Total: 2.7 g/dL (ref 1.5–4.5)
Glucose: 93 mg/dL (ref 65–99)
Potassium: 4.3 mmol/L (ref 3.5–5.2)
Sodium: 140 mmol/L (ref 134–144)
Total Protein: 7.1 g/dL (ref 6.0–8.5)

## 2018-10-14 LAB — LIPID PANEL
Chol/HDL Ratio: 2.9 ratio (ref 0.0–5.0)
Cholesterol, Total: 164 mg/dL (ref 100–199)
HDL: 56 mg/dL (ref >39)
LDL Calculated: 94 mg/dL (ref 0–99)
Triglycerides: 72 mg/dL (ref 0–149)
VLDL Cholesterol Cal: 14 mg/dL (ref 5–40)

## 2018-10-14 LAB — HEMOGLOBIN A1C
Est. average glucose Bld gHb Est-mCnc: 117 mg/dL
Hgb A1c MFr Bld: 5.7 % — ABNORMAL HIGH (ref 4.8–5.6)

## 2018-10-14 LAB — PSA: Prostate Specific Ag, Serum: 2.3 ng/mL (ref 0.0–4.0)

## 2018-10-26 ENCOUNTER — Ambulatory Visit (INDEPENDENT_AMBULATORY_CARE_PROVIDER_SITE_OTHER): Payer: Medicaid Other | Admitting: Orthopaedic Surgery

## 2018-10-26 DIAGNOSIS — M1711 Unilateral primary osteoarthritis, right knee: Secondary | ICD-10-CM

## 2018-10-26 MED ORDER — METHYLPREDNISOLONE ACETATE 40 MG/ML IJ SUSP
40.0000 mg | INTRAMUSCULAR | Status: AC | PRN
  Administered 2018-10-26: 16:00:00 40 mg via INTRA_ARTICULAR

## 2018-10-26 MED ORDER — LIDOCAINE HCL 1 % IJ SOLN
2.0000 mL | INTRAMUSCULAR | Status: AC | PRN
  Administered 2018-10-26: 16:00:00 2 mL

## 2018-10-26 MED ORDER — BUPIVACAINE HCL 0.5 % IJ SOLN
2.0000 mL | INTRAMUSCULAR | Status: AC | PRN
  Administered 2018-10-26: 16:00:00 2 mL via INTRA_ARTICULAR
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
## 2018-10-27 ENCOUNTER — Other Ambulatory Visit: Payer: Self-pay

## 2018-10-27 ENCOUNTER — Telehealth: Payer: Self-pay

## 2018-10-27 ENCOUNTER — Ambulatory Visit (INDEPENDENT_AMBULATORY_CARE_PROVIDER_SITE_OTHER): Payer: Medicaid Other | Admitting: Podiatry

## 2018-10-27 DIAGNOSIS — M7752 Other enthesopathy of left foot: Secondary | ICD-10-CM | POA: Diagnosis not present

## 2018-10-27 MED ORDER — MELOXICAM 15 MG PO TABS: 15.0000 mg | ORAL_TABLET | Freq: Every day | ORAL | 1 refills | Status: DC

## 2018-10-27 MED ORDER — METHYLPREDNISOLONE 4 MG PO TBPK: ORAL_TABLET | ORAL | 0 refills | Status: DC

## 2018-10-27 NOTE — Telephone Encounter (Signed)
Contacted pt to go over lab results pt is aware and doesn't have any questions or concerns 

## 2018-10-29 ENCOUNTER — Telehealth: Payer: Self-pay | Admitting: Podiatry

## 2018-10-29 NOTE — Telephone Encounter (Signed)
Pt was seen in office on Wednesday and was supposed to have three medications sent in to his pharmacy but only 2 were sent. Pt is following up on the third prescription, Pain medication. Please give patient a call.
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
## 2018-11-02 NOTE — Telephone Encounter (Signed)
Patient notified that per Dr. Amalia Hailey office note, only two medications were to be sent to pharmacy.  Patient stated that he has already picked up the two medicaitons.

## 2018-11-03 ENCOUNTER — Telehealth: Payer: Self-pay | Admitting: *Deleted

## 2018-11-03 NOTE — Telephone Encounter (Signed)
Patient no show PV today. Called patient, he did want to rescheduled PV, he request PV and colonoscopy cancelled. Pt states " he does not want anything going up my butt". I explained why we do colonoscopy and encouraged patient to speak with his PCP about other options instead of the colonoscopy. Offered Office visit with Dr.Armbruster, pt declined he states he will follow up with his PCP.   Colonoscopy and PV cancelled-pt is aware.

## 2018-11-10 ENCOUNTER — Encounter: Payer: Self-pay | Admitting: Orthopaedic Surgery

## 2018-11-10 ENCOUNTER — Other Ambulatory Visit: Payer: Self-pay

## 2018-11-10 ENCOUNTER — Ambulatory Visit (INDEPENDENT_AMBULATORY_CARE_PROVIDER_SITE_OTHER): Payer: Medicaid Other

## 2018-11-10 ENCOUNTER — Ambulatory Visit (INDEPENDENT_AMBULATORY_CARE_PROVIDER_SITE_OTHER): Payer: Medicaid Other | Admitting: Orthopaedic Surgery

## 2018-11-10 VITALS — Ht 71.0 in | Wt 200.0 lb

## 2018-11-10 DIAGNOSIS — M25551 Pain in right hip: Secondary | ICD-10-CM
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
## 2018-11-10 NOTE — Progress Notes (Signed)
Subjective: Patient is here for ultrasound-guided intra-articular right hip injection.   Pain in the groin.  Objective:  Pain with IR.  Procedure: Ultrasound-guided right hip injection: After sterile prep with Betadine, injected 8 cc 1% lidocaine without epinephrine and 40 mg methylprednisolone using a 22-gauge spinal needle, passing the needle through the iliofemoral ligament into the femoral head/neck junction.  Injectate seen filling joint capsule.  Good immediate relief.

## 2018-11-12 ENCOUNTER — Other Ambulatory Visit: Payer: Self-pay

## 2018-11-12 ENCOUNTER — Encounter: Payer: Self-pay | Admitting: Nurse Practitioner

## 2018-11-12 ENCOUNTER — Ambulatory Visit: Payer: Medicaid Other | Attending: Nurse Practitioner | Admitting: Nurse Practitioner

## 2018-11-12 VITALS — BP 107/77 | HR 57 | Temp 98.8°F | Ht 68.2 in | Wt 192.0 lb

## 2018-11-12 DIAGNOSIS — M79673 Pain in unspecified foot: Secondary | ICD-10-CM | POA: Insufficient documentation

## 2018-11-12 DIAGNOSIS — Z791 Long term (current) use of non-steroidal anti-inflammatories (NSAID): Secondary | ICD-10-CM | POA: Insufficient documentation

## 2018-11-12 DIAGNOSIS — R0600 Dyspnea, unspecified: Secondary | ICD-10-CM | POA: Diagnosis not present

## 2018-11-12 DIAGNOSIS — M5441 Lumbago with sciatica, right side: Secondary | ICD-10-CM | POA: Insufficient documentation

## 2018-11-12 DIAGNOSIS — F17200 Nicotine dependence, unspecified, uncomplicated: Secondary | ICD-10-CM

## 2018-11-12 DIAGNOSIS — Z1211 Encounter for screening for malignant neoplasm of colon: Secondary | ICD-10-CM | POA: Insufficient documentation

## 2018-11-12 DIAGNOSIS — M25551 Pain in right hip: Secondary | ICD-10-CM | POA: Insufficient documentation

## 2018-11-12 DIAGNOSIS — M1711 Unilateral primary osteoarthritis, right knee: Secondary | ICD-10-CM | POA: Insufficient documentation

## 2018-11-12 DIAGNOSIS — G8929 Other chronic pain: Secondary | ICD-10-CM | POA: Diagnosis not present

## 2018-11-12 DIAGNOSIS — I10 Essential (primary) hypertension: Secondary | ICD-10-CM | POA: Diagnosis not present

## 2018-11-12 DIAGNOSIS — M549 Dorsalgia, unspecified: Secondary | ICD-10-CM | POA: Diagnosis not present

## 2018-11-12 DIAGNOSIS — Z0100 Encounter for examination of eyes and vision without abnormal findings: Secondary | ICD-10-CM

## 2018-11-12 MED ORDER — ALBUTEROL SULFATE HFA 108 (90 BASE) MCG/ACT IN AERS: 2.0000 | INHALATION_SPRAY | Freq: Four times a day (QID) | RESPIRATORY_TRACT | 1 refills | Status: AC | PRN

## 2018-11-12 MED ORDER — DULOXETINE HCL 30 MG PO CPEP
30.0000 mg | ORAL_CAPSULE | Freq: Every day | ORAL | 0 refills | Status: AC
Start: 2018-11-12 — End: 2018-12-12
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
## 2018-11-17 ENCOUNTER — Encounter: Payer: Medicaid Other | Admitting: Gastroenterology

## 2018-11-24 ENCOUNTER — Other Ambulatory Visit: Payer: Self-pay

## 2018-11-24 ENCOUNTER — Ambulatory Visit (INDEPENDENT_AMBULATORY_CARE_PROVIDER_SITE_OTHER): Payer: Medicaid Other | Admitting: Podiatry

## 2018-11-24 DIAGNOSIS — G5762 Lesion of plantar nerve, left lower limb: Secondary | ICD-10-CM | POA: Diagnosis not present

## 2018-11-26 ENCOUNTER — Other Ambulatory Visit: Payer: Self-pay

## 2018-11-26 ENCOUNTER — Ambulatory Visit (HOSPITAL_COMMUNITY)
Admission: RE | Admit: 2018-11-26 | Discharge: 2018-11-26 | Disposition: A | Discharge status: Home / Self Care | Payer: Medicaid Other | Source: Ambulatory Visit | Attending: Nurse Practitioner | Admitting: Nurse Practitioner

## 2018-11-26 DIAGNOSIS — R0600 Dyspnea, unspecified: Secondary | ICD-10-CM | POA: Diagnosis not present
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
## 2018-12-01 ENCOUNTER — Ambulatory Visit: Payer: Medicaid Other | Admitting: Orthopaedic Surgery

## 2018-12-07 ENCOUNTER — Other Ambulatory Visit: Payer: Self-pay

## 2018-12-07 ENCOUNTER — Encounter: Payer: Self-pay | Admitting: Nurse Practitioner

## 2018-12-07 ENCOUNTER — Ambulatory Visit: Payer: Medicaid Other | Attending: Nurse Practitioner | Admitting: Nurse Practitioner

## 2018-12-07 DIAGNOSIS — M1711 Unilateral primary osteoarthritis, right knee: Secondary | ICD-10-CM | POA: Diagnosis not present

## 2018-12-07 DIAGNOSIS — G8929 Other chronic pain: Secondary | ICD-10-CM | POA: Insufficient documentation

## 2018-12-07 DIAGNOSIS — M5441 Lumbago with sciatica, right side: Secondary | ICD-10-CM | POA: Diagnosis present

## 2018-12-07 DIAGNOSIS — F17210 Nicotine dependence, cigarettes, uncomplicated: Secondary | ICD-10-CM | POA: Diagnosis not present

## 2018-12-07 DIAGNOSIS — Z8249 Family history of ischemic heart disease and other diseases of the circulatory system: Secondary | ICD-10-CM | POA: Insufficient documentation

## 2018-12-07 DIAGNOSIS — M5030 Other cervical disc degeneration, unspecified cervical region: Secondary | ICD-10-CM | POA: Insufficient documentation
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
## 2018-12-08 ENCOUNTER — Encounter: Payer: Self-pay | Admitting: Orthopaedic Surgery

## 2018-12-08 ENCOUNTER — Other Ambulatory Visit: Payer: Self-pay

## 2018-12-08 ENCOUNTER — Ambulatory Visit (INDEPENDENT_AMBULATORY_CARE_PROVIDER_SITE_OTHER): Payer: Medicaid Other | Admitting: Orthopaedic Surgery

## 2018-12-08 DIAGNOSIS — M1611 Unilateral primary osteoarthritis, right hip: Secondary | ICD-10-CM
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
## 2018-12-17 ENCOUNTER — Telehealth: Payer: Self-pay | Admitting: Orthopaedic Surgery

## 2018-12-17 NOTE — Telephone Encounter (Signed)
Records 9-9 to present faxed to [place] at [employer] [phone]

## 2018-12-20 ENCOUNTER — Encounter: Payer: Medicaid Other | Admitting: Physical Medicine & Rehabilitation

## 2018-12-22 ENCOUNTER — Ambulatory Visit: Payer: Medicaid Other | Admitting: Podiatry

## 2019-03-24 ENCOUNTER — Telehealth: Payer: Self-pay | Admitting: Nurse Practitioner

## 2019-03-24 NOTE — Telephone Encounter (Signed)
Ceceila(wife) would like to talk  PCP as Patient Abbott passed away 2019/03/31 and needs paperwork for the [facility]. Please call her at [phone]

## 2019-03-25 ENCOUNTER — Telehealth: Payer: Self-pay | Admitting: Nurse Practitioner

## 2019-03-25 NOTE — Telephone Encounter (Signed)
Frederick Baldwin came and dropped of death certification and I came and took to FNP Zelda at  [phone] to pick death certification up.

## 2019-03-28 ENCOUNTER — Telehealth: Payer: Self-pay | Admitting: Nurse Practitioner

## 2019-03-28 NOTE — Telephone Encounter (Signed)
Ceceila called requesting to speak with pcp regarding a previous conversation about the patient. Please fu at your earliest convenience.

## 2019-03-31 NOTE — Telephone Encounter (Signed)
CMA attempt to reach Frederick Baldwin regarding her concerns. No answer and LVM to call back.

## 2019-03-31 NOTE — Telephone Encounter (Signed)
Wife calling about patient's death certificate. Deceased 08-19-18.

## 2019-03-31 NOTE — Telephone Encounter (Signed)
THe patient is not married just [place]. I am waiting for a new death certificate at this time

## 2019-04-01 ENCOUNTER — Telehealth: Payer: Self-pay | Admitting: Nurse Practitioner

## 2019-04-01 NOTE — Telephone Encounter (Signed)
error 

## 2019-04-01 NOTE — Telephone Encounter (Signed)
Spoke to [place] and advised of  Frederick Baldwin's message.

## 2019-04-01 NOTE — Telephone Encounter (Signed)
Patient is calling requesting to speak with [facility]

## 2019-04-01 NOTE — Telephone Encounter (Signed)
Spoke to [employer] to informed the death certificate is ready for pick up.  CMA will placed at the front desk.

## 2019-04-04 DEATH — deceased

## 2020-11-09 IMAGING — CR RIGHT KNEE - 1-2 VIEW
2 series · 2 of 2 positions shown · non-contrast
Comparison: Right knee radiographs July 15, 2012

CLINICAL DATA: Chronic right knee pain, prior right knee
arthroscopy

EXAM:
RIGHT KNEE - 1-2 VIEW

[knee ap]
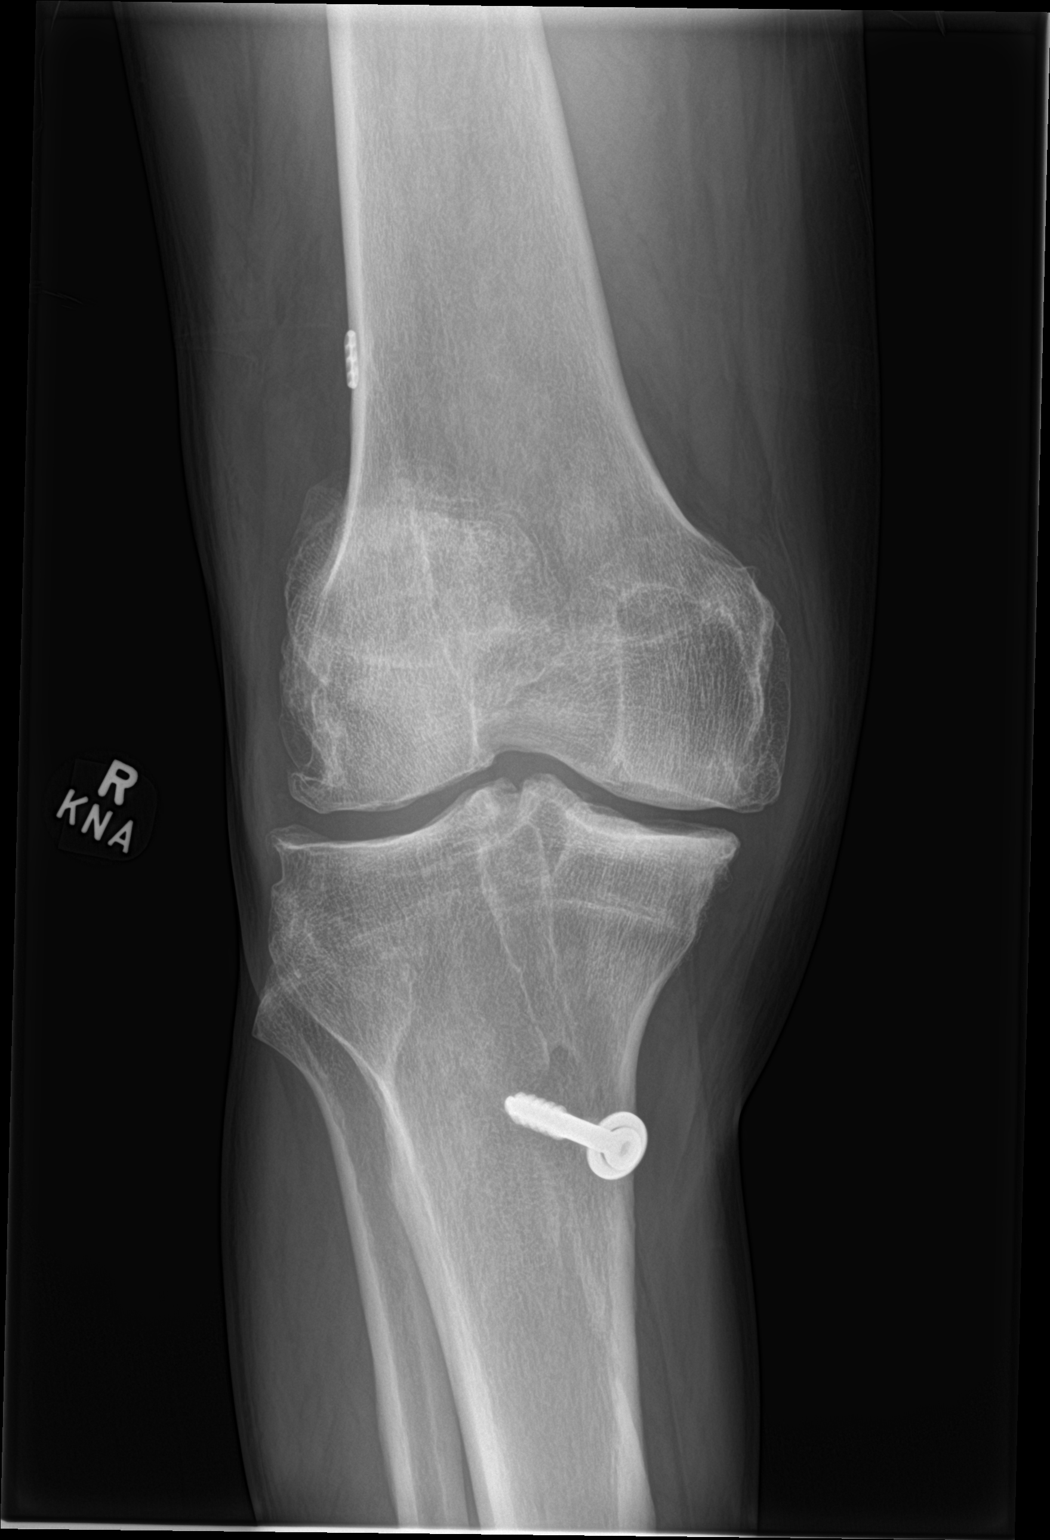

[knee lat]
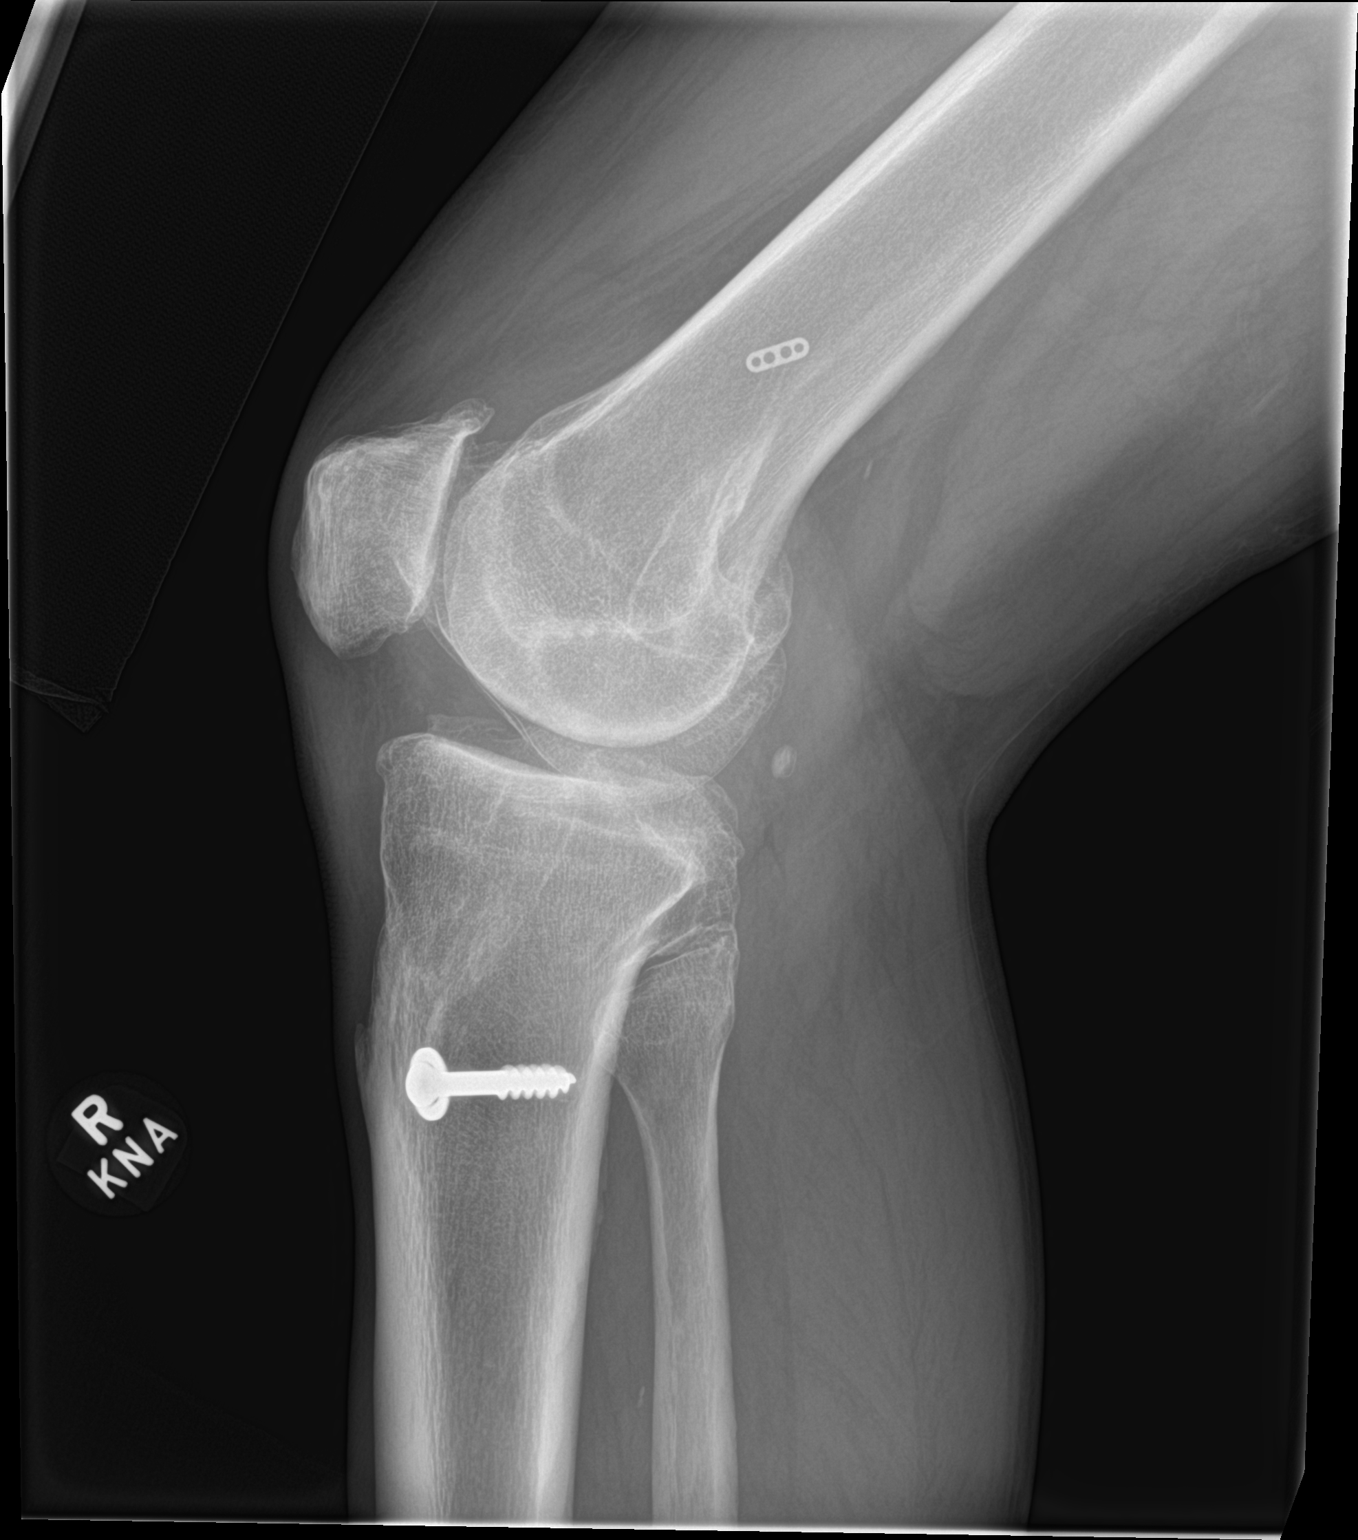

[2 of 2 positions shown; findings below may reference images not displayed]

FINDINGS: There is mild soft tissue swelling of the right knee without acute
fracture or traumatic malalignment. There are postsurgical changes
from prior ACL reconstruction. There is a background of
tricompartmental osteoarthrosis most pronounced in the
patellofemoral compartment. There is small joint space effusion.
Trace vascular calcium is noted in the posterior knee.
IMPRESSION: 1. Minimal swelling and right knee joint effusion without acute
osseous abnormality.
2. Tricompartmental osteoarthrosis, most pronounced in the
patellofemoral compartment.

## 2020-11-09 IMAGING — CR LEFT FOOT - COMPLETE 3+ VIEW
3 series · 3 of 3 positions shown · non-contrast
Comparison: None.

CLINICAL DATA: Chronic left foot pain, intermittent inability to
bear weight

EXAM:
LEFT FOOT - COMPLETE 3+ VIEW

[foot ap]
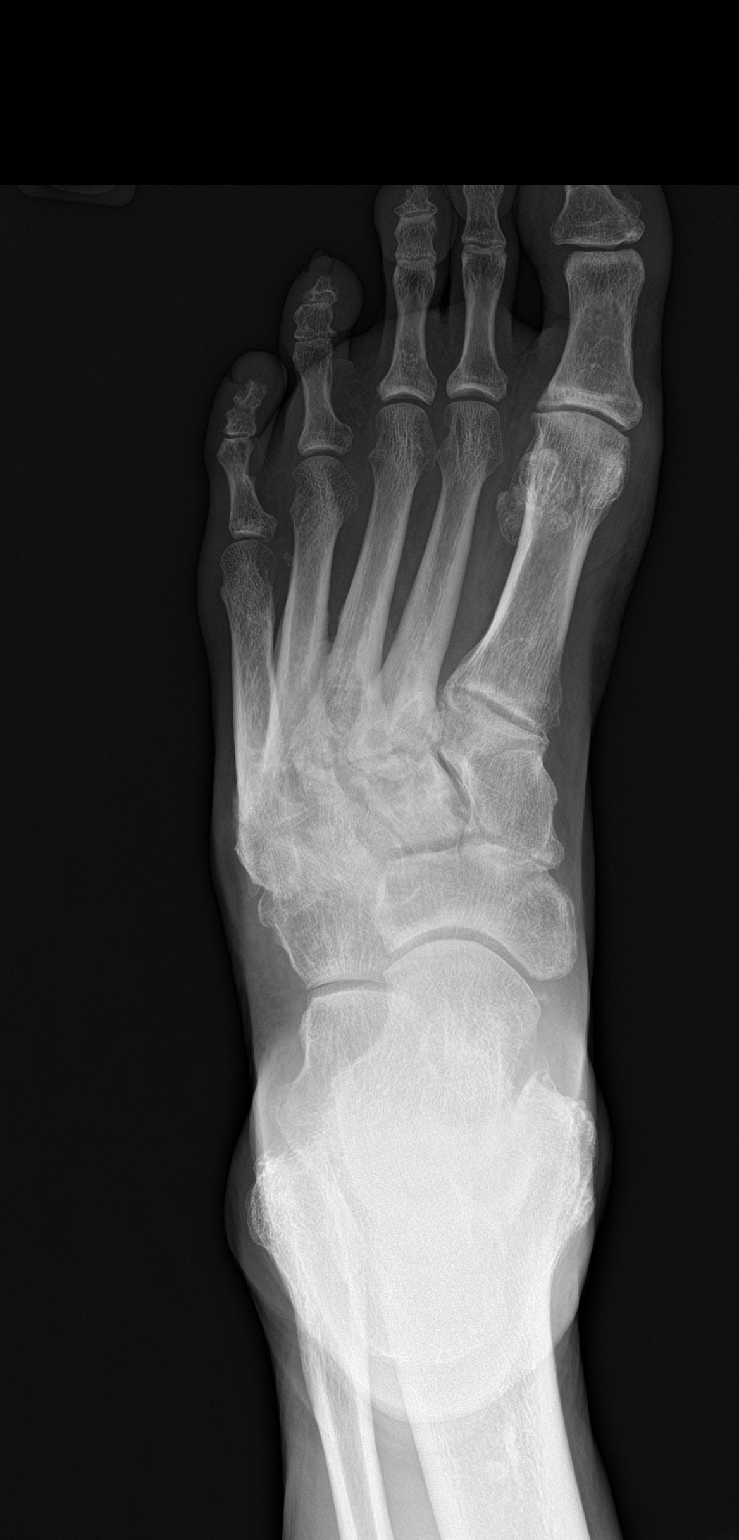

[foot obl]
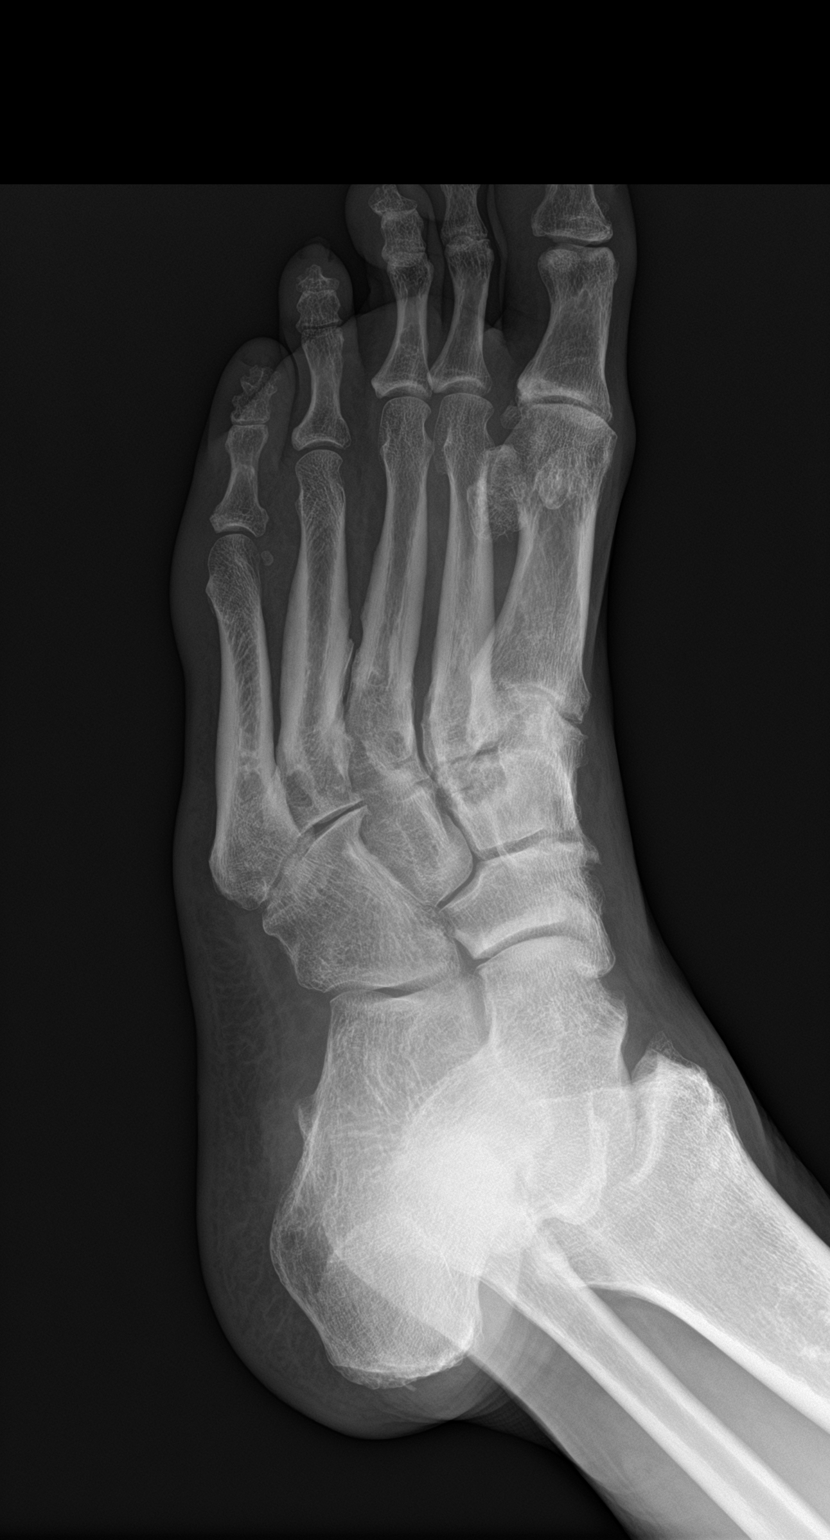

[foot lat]
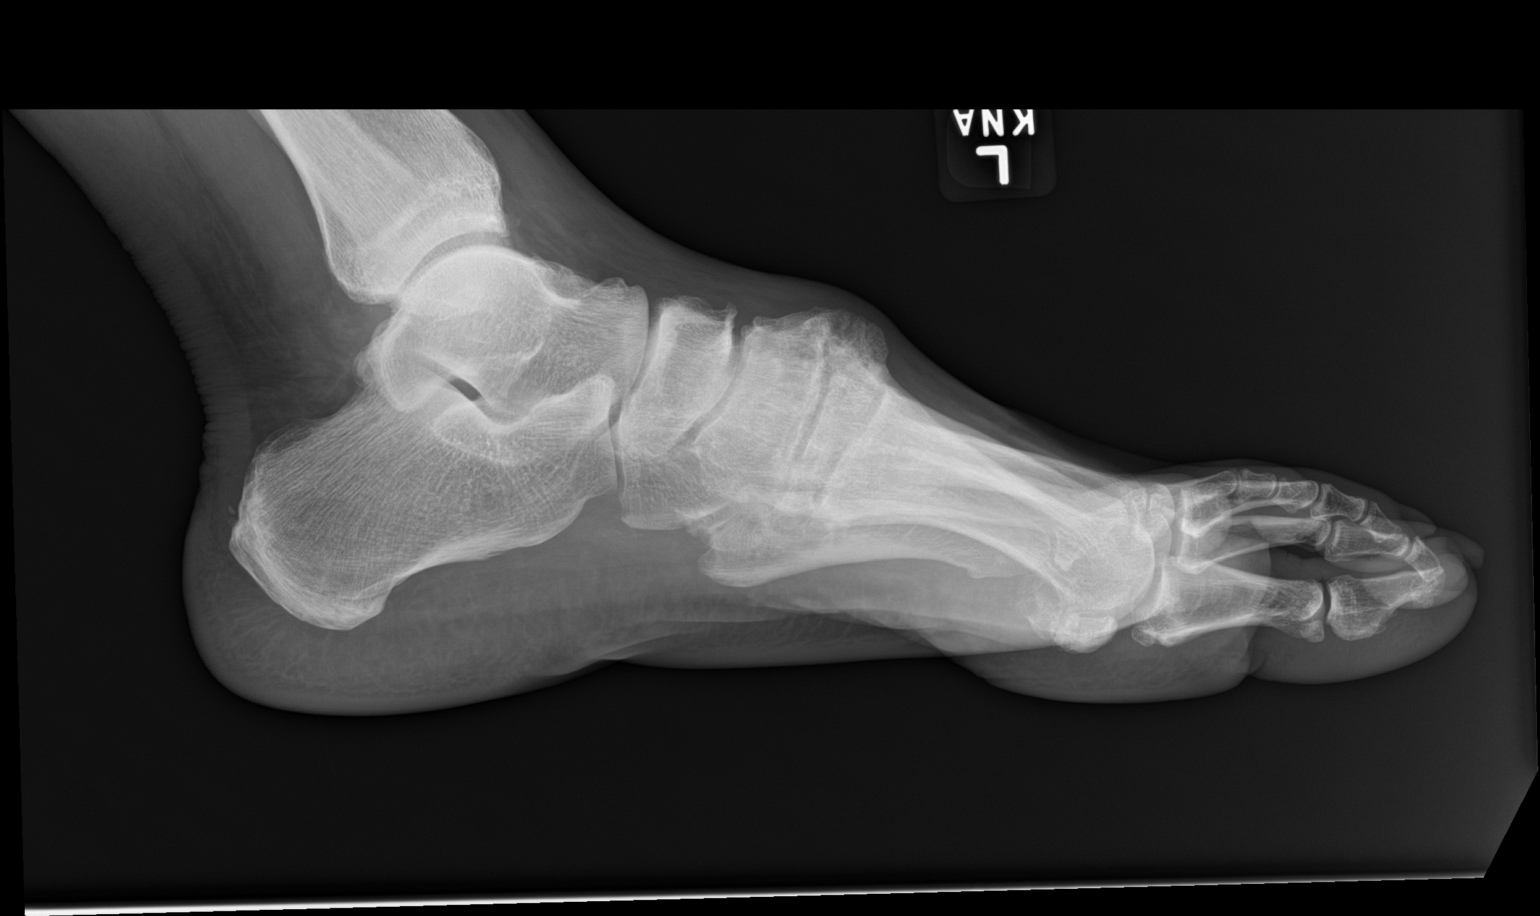

[3 of 3 positions shown; findings below may reference images not displayed]

FINDINGS: No acute fracture or traumatic malalignment. There is extensive
hypertrophic osteoarthrosis centered at along the Lisfranc
articulations of the midfoot. Overlying soft tissue thickening is
present. Midfoot and hindfoot alignment is grossly preserved though
incompletely assessed on nonweightbearing films. Congenital fusion
of the fifth middle and distal phalanx. Small calcaneal enthesophyte
at the Achilles insertion.
IMPRESSION: 1. No acute osseous abnormality.
2. Extensive hypertrophic osteoarthrosis centered at the Lisfranc
articulations of the midfoot.
